# Patient Record
Sex: Female | Born: 2000 | Race: White | Hispanic: No | Marital: Single | Smoking: Never smoker
Health system: Southern US, Community
[De-identification: ages and names within clinical notes are randomized; demographics above are authoritative.]

## PROBLEM LIST (undated history)

## (undated) HISTORY — PX: MYRINGOTOMY WITH TUBE PLACEMENT: SHX5663

---
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
## 2023-01-13 IMAGING — DX DG KNEE COMPLETE 4+V*R*
4 series · 4 of 4 positions shown · non-contrast
Comparison: None.

CLINICAL DATA: Right knee pain, weakness

EXAM:
RIGHT KNEE - COMPLETE 4+ VIEW

[knee ap]
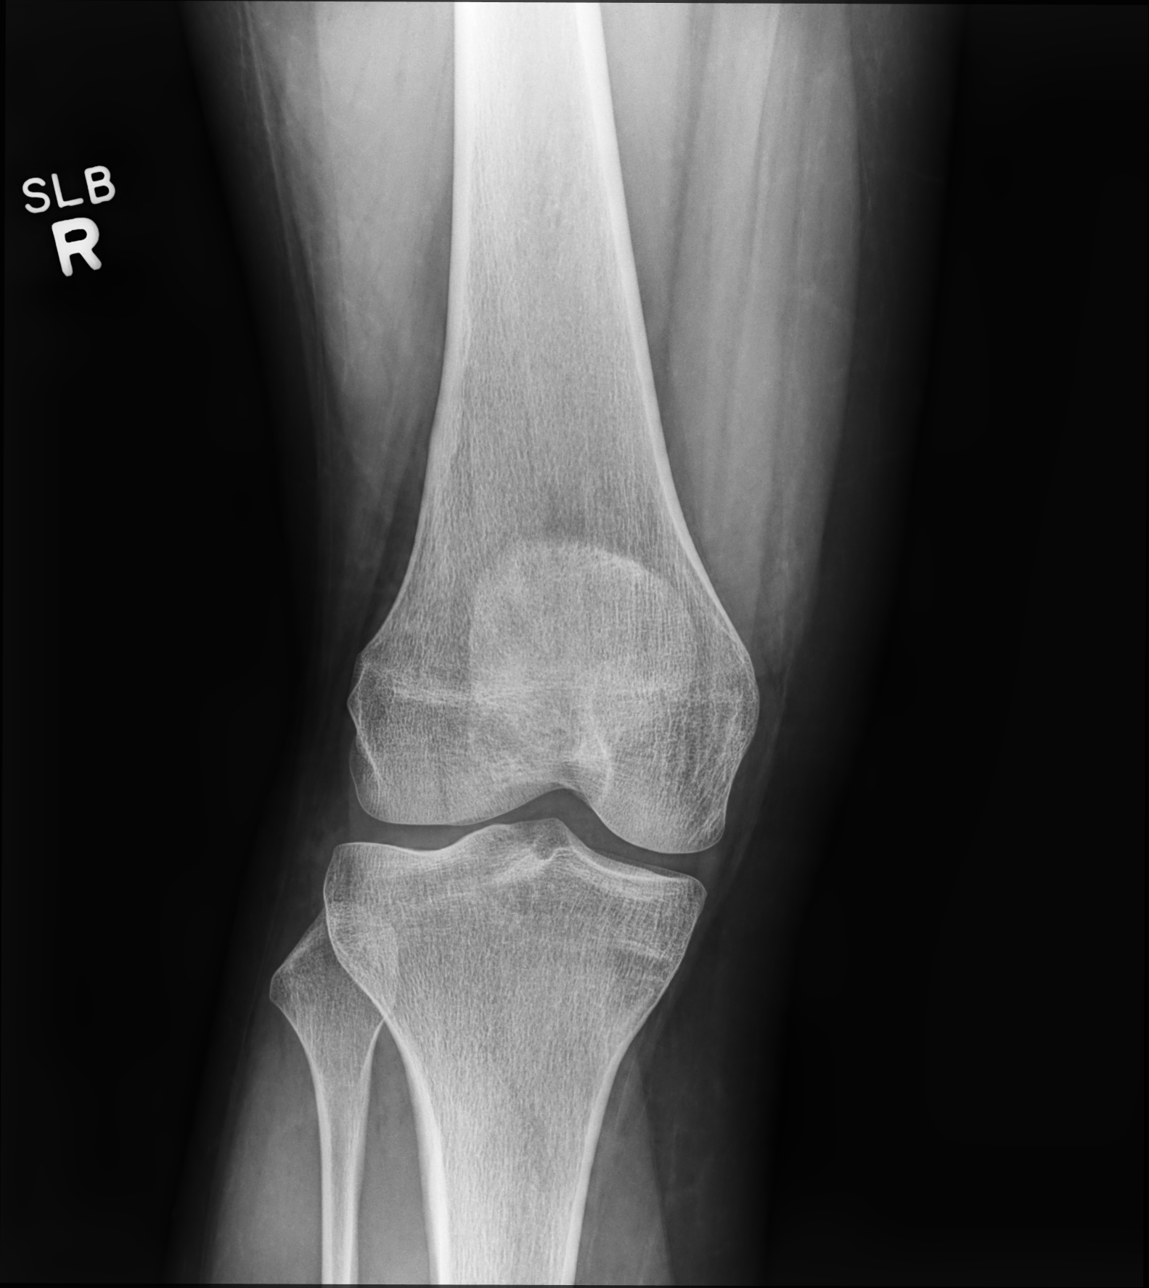

[knee mlo (1 of 2)]
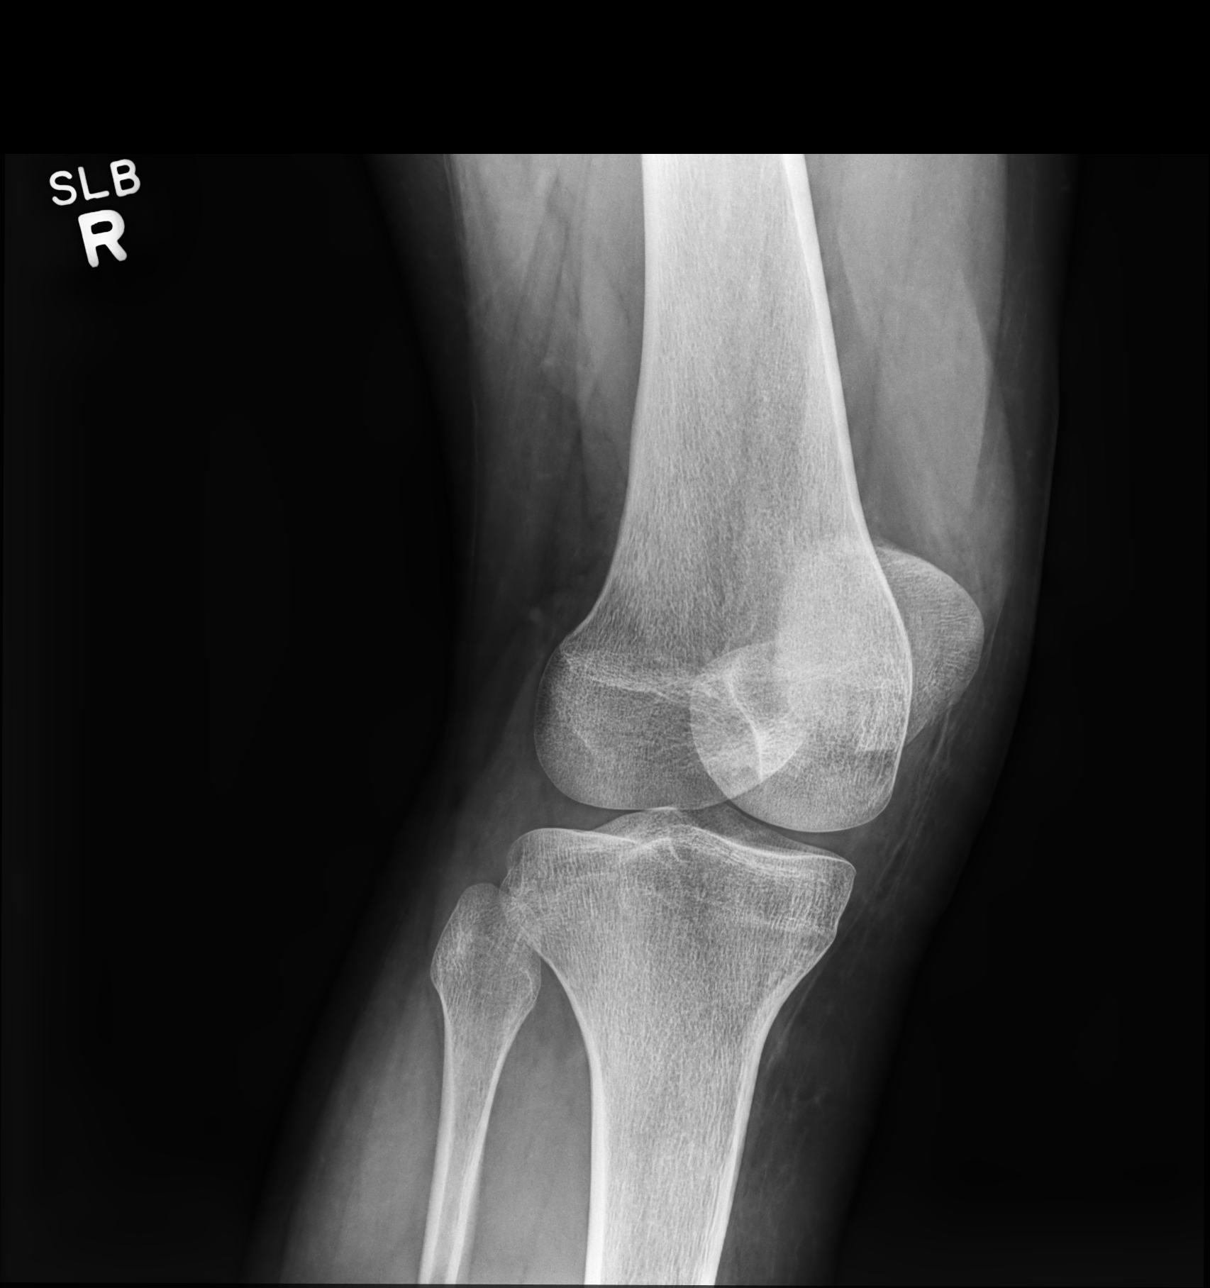

[knee mlo (2 of 2)]
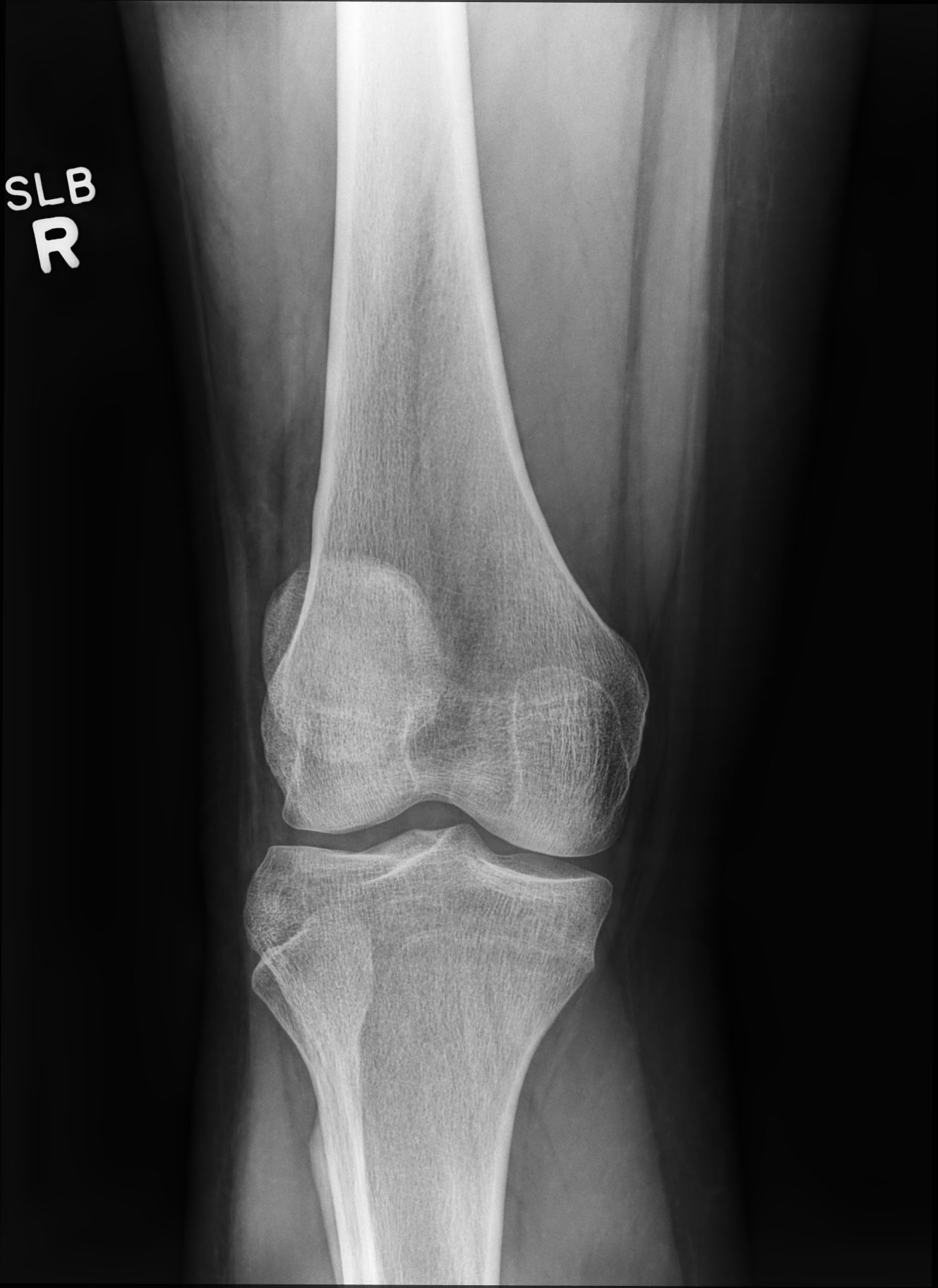

[knee lat]
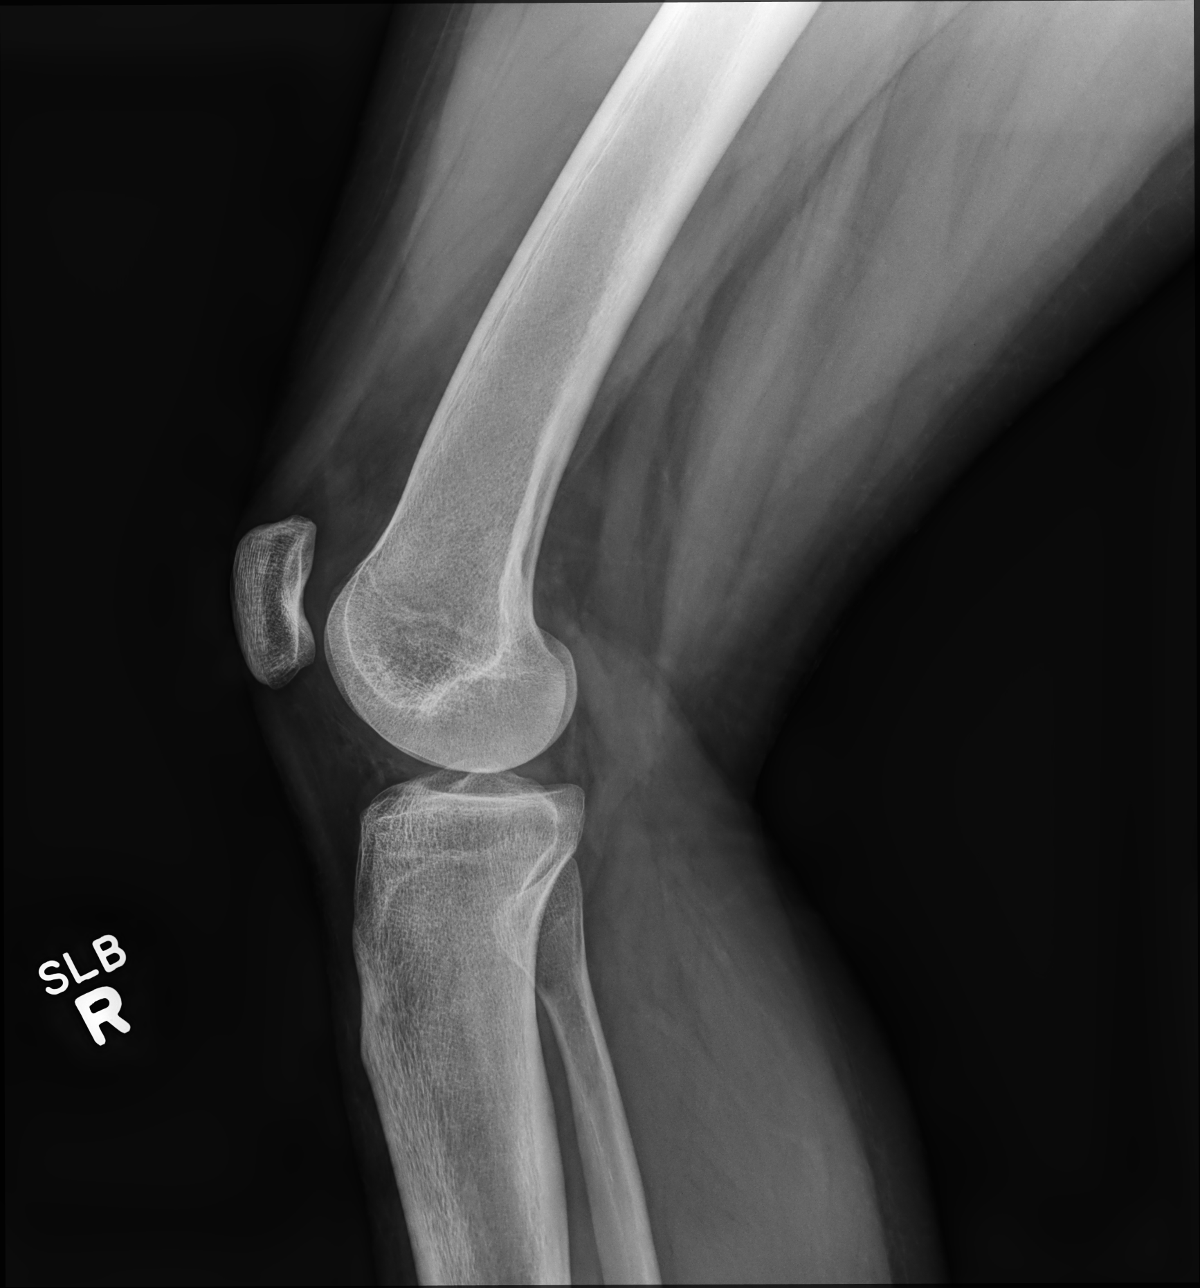

[4 of 4 positions shown; findings below may reference images not displayed]

FINDINGS: No evidence of fracture, dislocation, or joint effusion. No evidence
of arthropathy or other focal bone abnormality. Soft tissues are
unremarkable.
IMPRESSION: Negative.

## 2023-08-11 ENCOUNTER — Emergency Department (HOSPITAL_BASED_OUTPATIENT_CLINIC_OR_DEPARTMENT_OTHER)
Admission: EM | Admit: 2023-08-11 | Discharge: 2023-08-11 | Disposition: A | Discharge status: Home / Self Care | Payer: Medicaid Other | Attending: Emergency Medicine | Admitting: Emergency Medicine

## 2023-08-11 ENCOUNTER — Encounter (HOSPITAL_BASED_OUTPATIENT_CLINIC_OR_DEPARTMENT_OTHER): Payer: Self-pay | Admitting: Emergency Medicine

## 2023-08-11 DIAGNOSIS — W228XXA Striking against or struck by other objects, initial encounter: Secondary | ICD-10-CM | POA: Diagnosis not present

## 2023-08-11 DIAGNOSIS — S0502XA Injury of conjunctiva and corneal abrasion without foreign body, left eye, initial encounter: Secondary | ICD-10-CM | POA: Insufficient documentation

## 2023-08-11 MED ORDER — FLUORESCEIN SODIUM 1 MG OP STRP
1.0000 | ORAL_STRIP | Freq: Once | OPHTHALMIC | Status: AC
  Administered 2023-08-11: 1 via OPHTHALMIC
  Filled 2023-08-11: qty 1

## 2023-08-11 MED ORDER — CIPROFLOXACIN HCL 0.3 % OP SOLN: 2.0000 [drp] | Freq: Four times a day (QID) | OPHTHALMIC | 0 refills | Status: AC

## 2023-08-11 MED ORDER — TETRACAINE HCL 0.5 % OP SOLN
2.0000 [drp] | Freq: Once | OPHTHALMIC | Status: AC
  Administered 2023-08-11: 2 [drp] via OPHTHALMIC
  Filled 2023-08-11: qty 4
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
## 2023-08-11 NOTE — Discharge Instructions (Signed)
You were seen in the ER today for eye injury. Your exam shows a corneal abrasion from the injury you sustained. Antibiotic drops were sent to your pharmacy which you should take as prescribed for the next 5 days or so. If symptoms worsen, return to the ER. Otherwise, follow up with an ophthalmologist in the next 48 hours as possible.
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
